# Patient Record
Sex: Male | Born: 1970 | Race: White | Hispanic: Yes | Marital: Married | Smoking: Never smoker
Health system: Southern US, Community
[De-identification: ages and names within clinical notes are randomized; demographics above are authoritative.]

## PROBLEM LIST (undated history)

## (undated) HISTORY — PX: NO PAST SURGERIES: SHX2092

---

## 2008-03-01 ENCOUNTER — Ambulatory Visit: Payer: Self-pay | Admitting: Internal Medicine

## 2010-06-26 ENCOUNTER — Ambulatory Visit: Payer: Self-pay | Admitting: Internal Medicine

## 2010-08-29 ENCOUNTER — Encounter: Payer: Self-pay | Admitting: Surgery

## 2010-09-17 ENCOUNTER — Encounter: Payer: Self-pay | Admitting: Surgery

## 2011-01-01 ENCOUNTER — Emergency Department: Payer: Self-pay | Admitting: Emergency Medicine

## 2011-12-04 IMAGING — CR RIGHT RING FINGER 2+V
1 series · 3 of 3 positions shown · non-contrast
Comparison: none

REASON FOR EXAM: smashed finger
COMMENTS:   LMP: (Male)

PROCEDURE:     MDR - MDR FINGER RING 4TH DIG RT HAND  - June 26, 2010  [DATE]
RESULT:     Images of the right fourth digit demonstrate no definite
fracture, dislocation or foreign body.

[Series 1: view not recorded · 0.17mm/px · 3 of 3 slices shown]
[im 1/3]
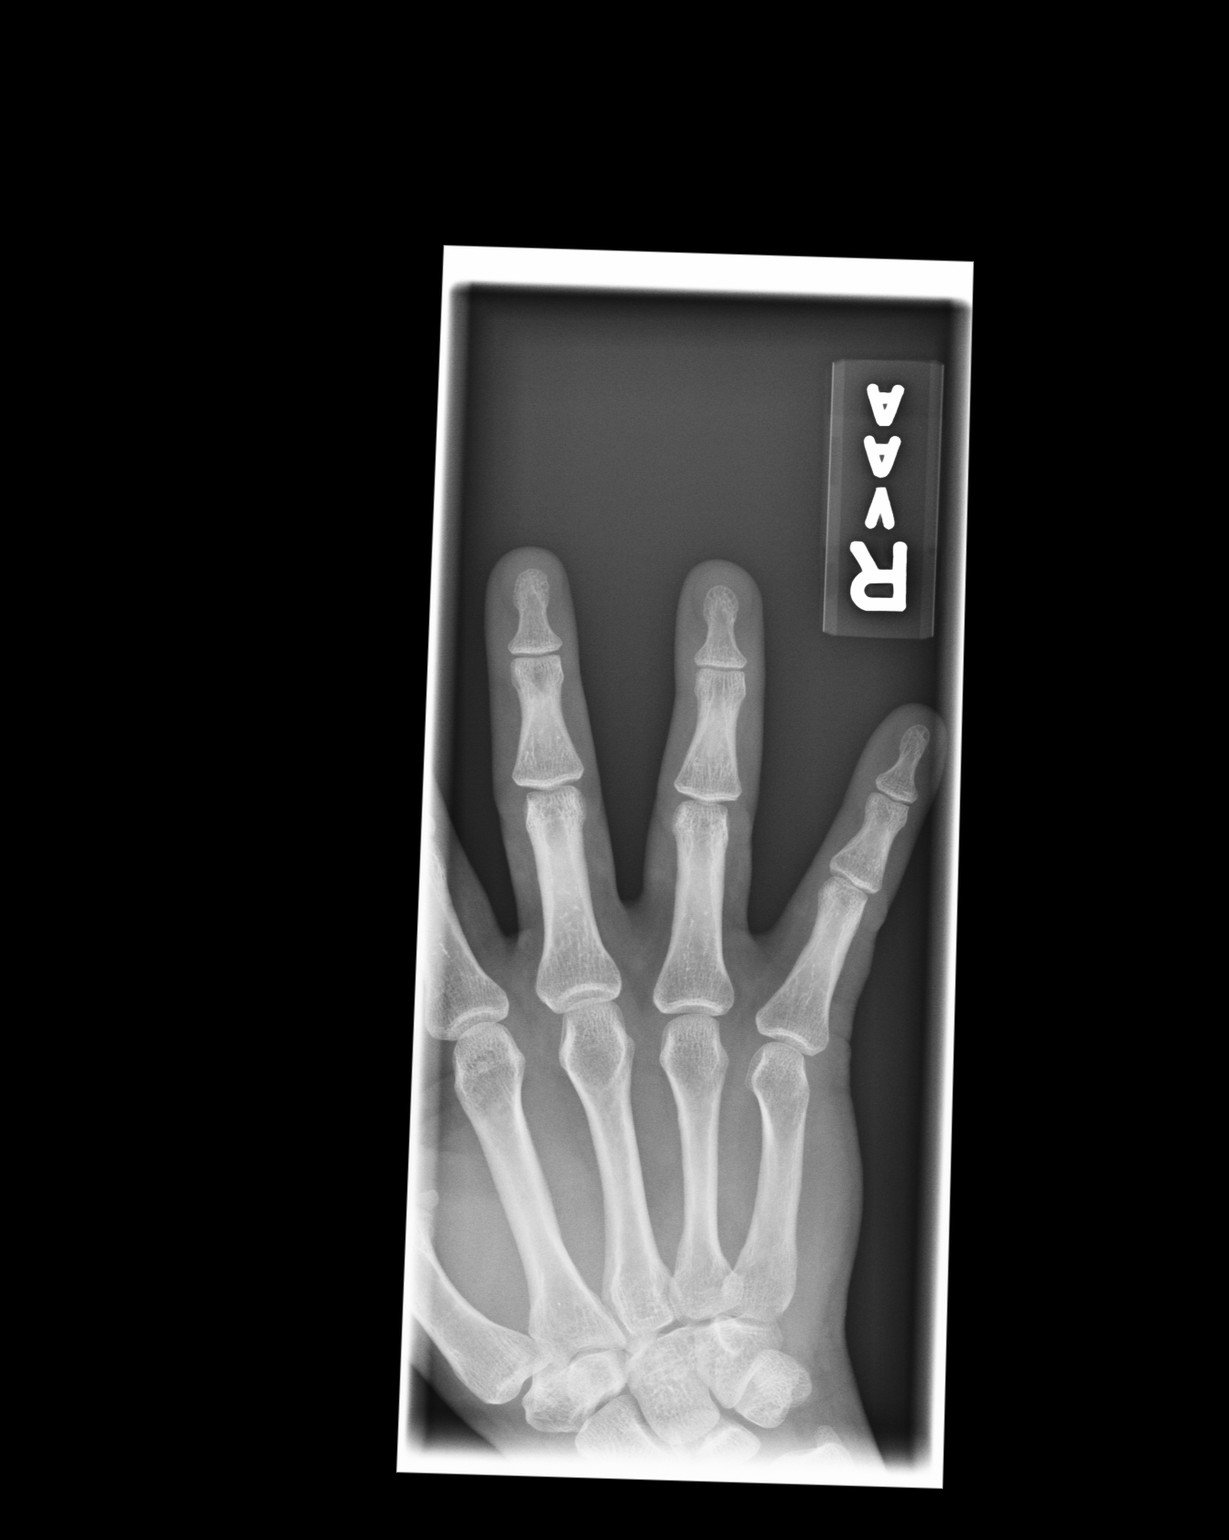
[im 2/3]
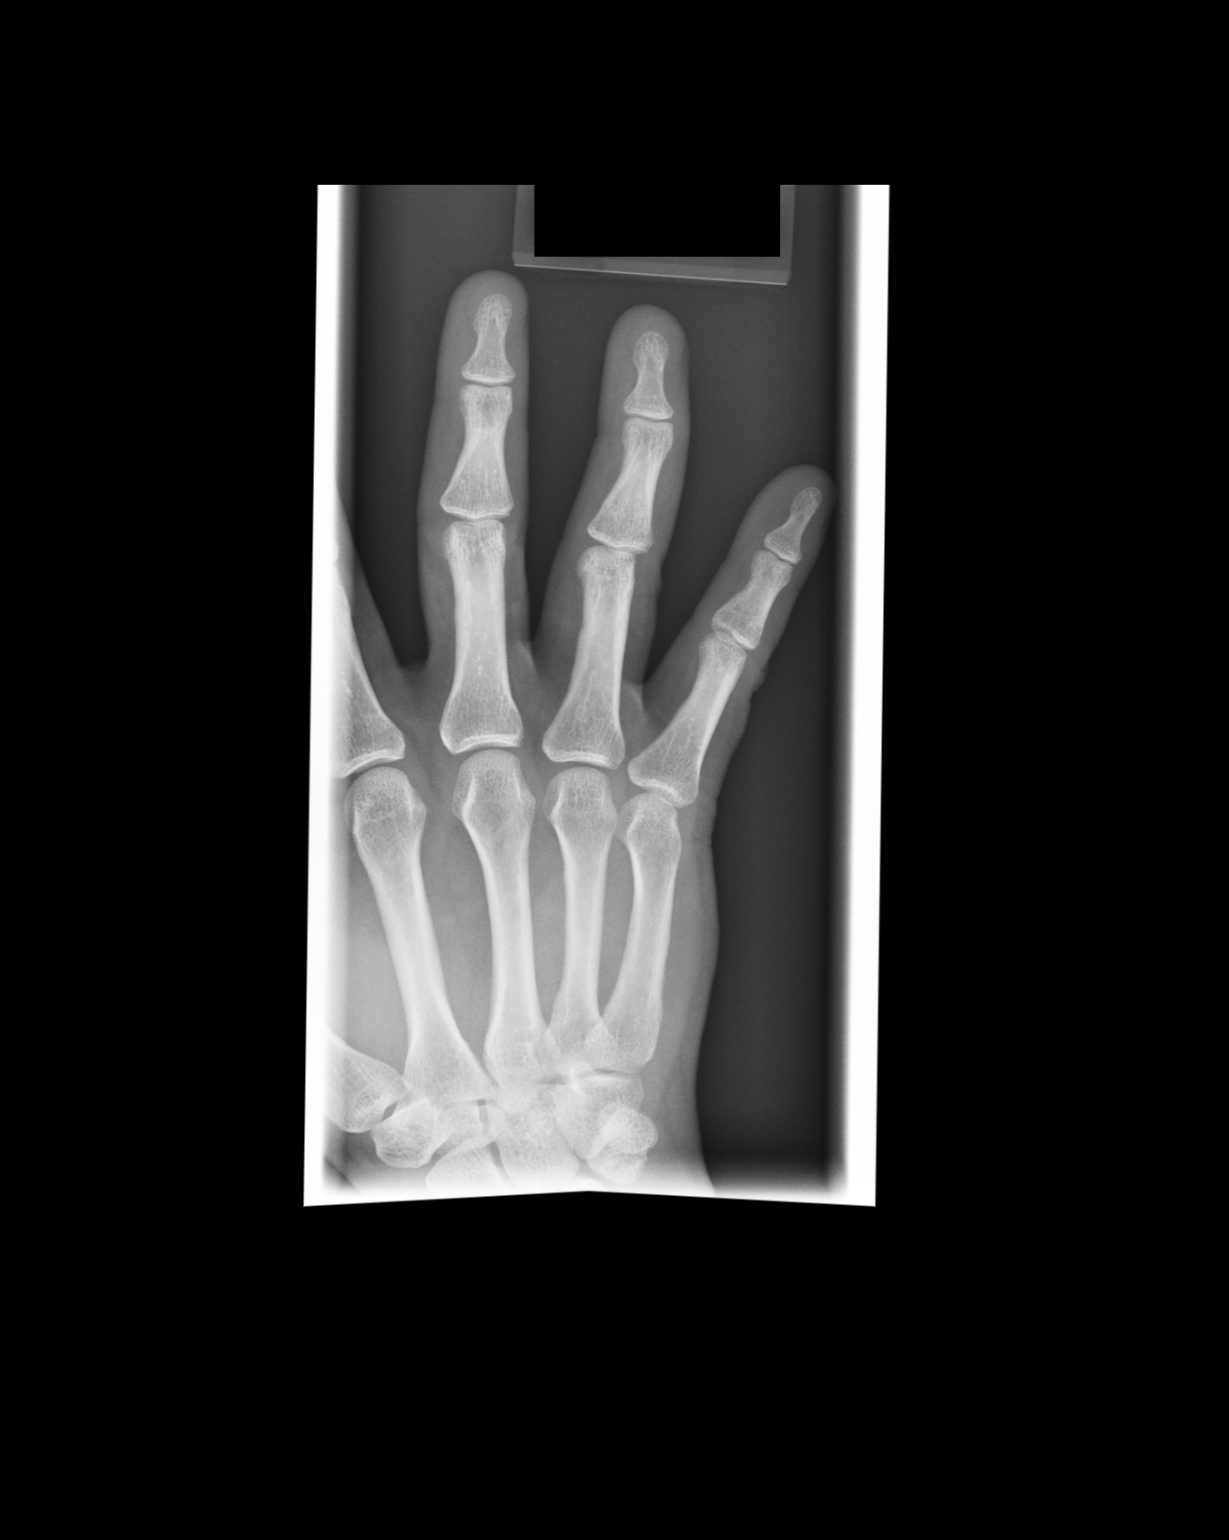
[im 3/3]
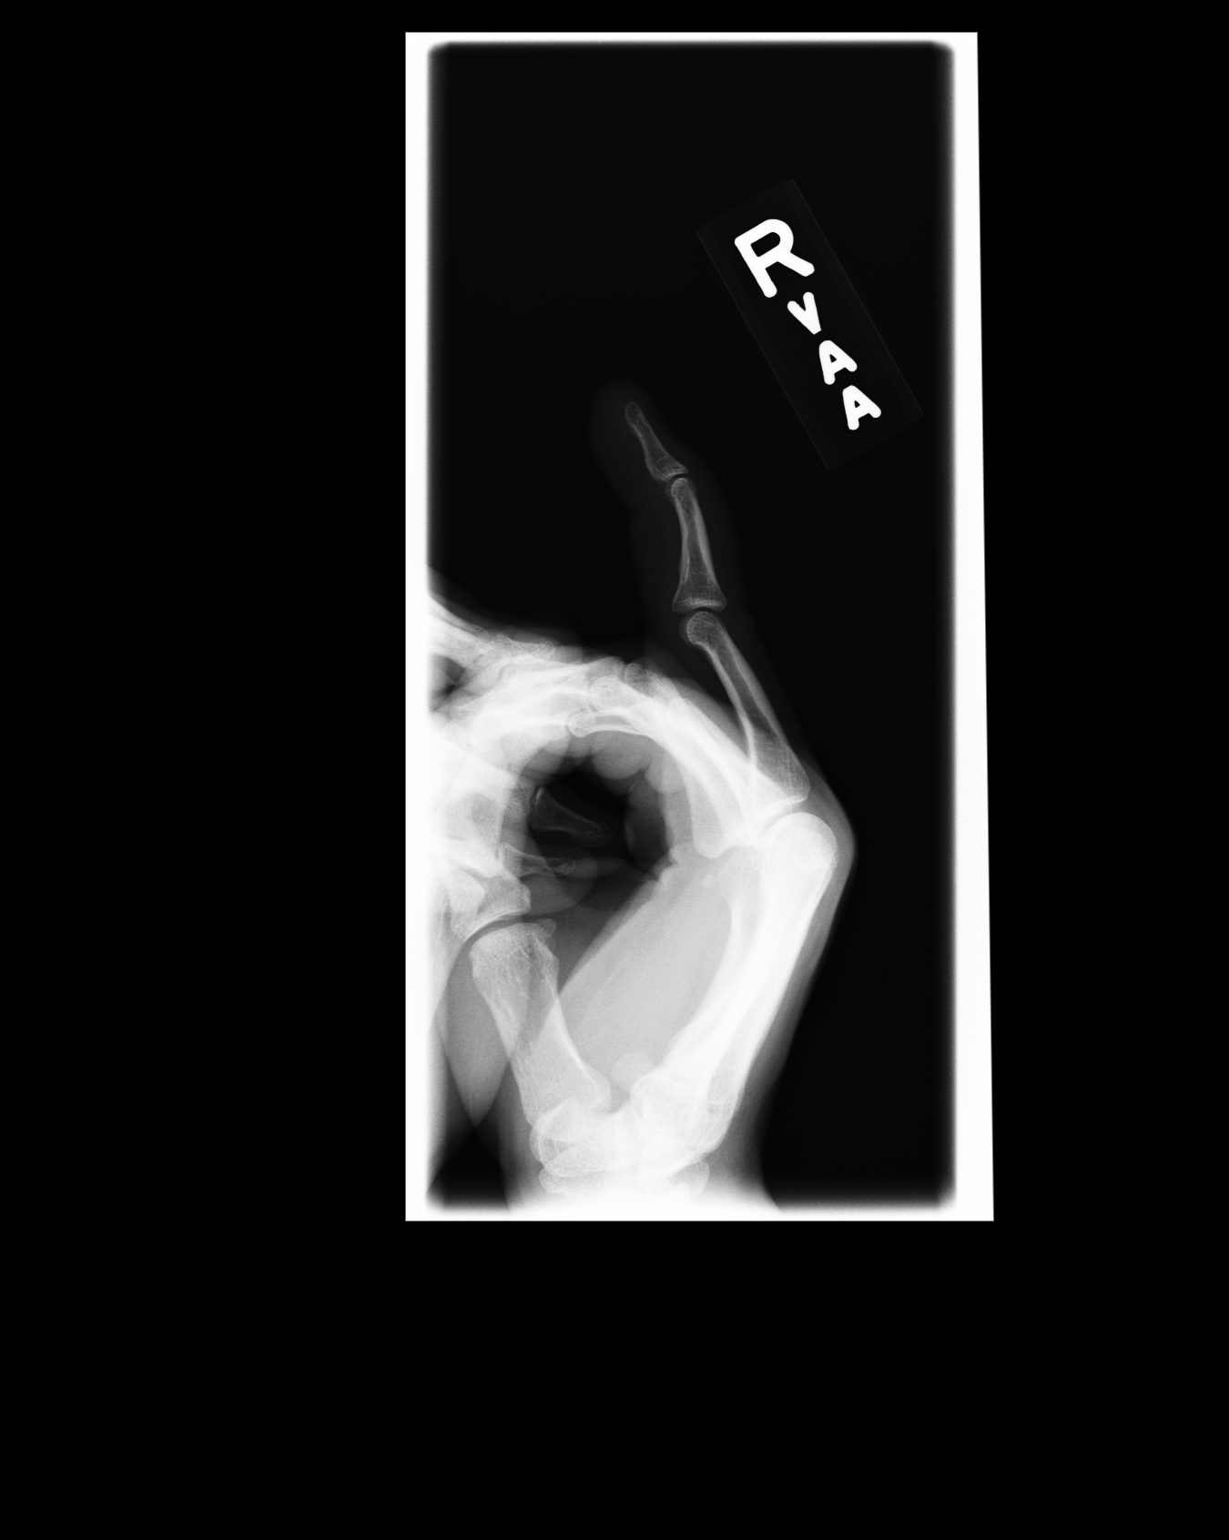

[3 of 3 positions shown; findings below may reference images not displayed]

IMPRESSION: No acute bony abnormality appreciated.

## 2012-06-10 IMAGING — CT CT ABD-PELV W/ CM
1 of 2 series · 15 of 32 positions shown, 19 images · IV contrast (isovue)
Comparison: None

REASON FOR EXAM: (1) diffuse abd pain w/ n/v and diarrhea; (2) diffuse
abd pain w/ n/v and diarrh
COMMENTS:

PROCEDURE:     CT  - CT ABDOMEN / PELVIS  W  - January 01, 2011  [DATE]
RESULT:     History: Abdominal pain
TECHNIQUE: Multiple axial images of the abdomen and pelvis were performed
from the lung bases to the pubic symphysis, with p.o. contrast and with 100
ml of Isovue 370 intravenous contrast.

[Series 2: 3mm soft tissue · axial · 0.79mm/px · z∈[-1062,-602]mm · 15 of 167 slices shown, 19 images]
[im 7/167  soft-tissue]
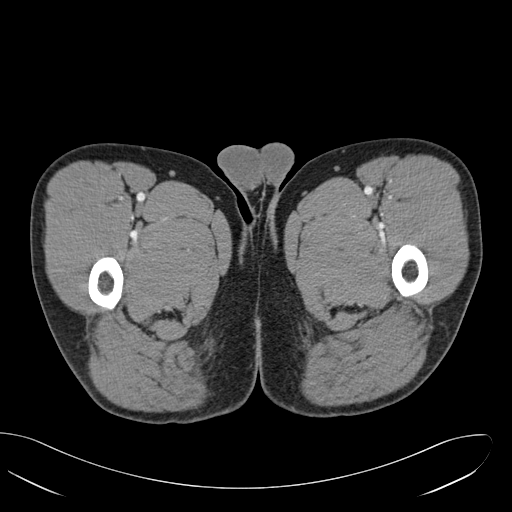
[im 7/167  bone]
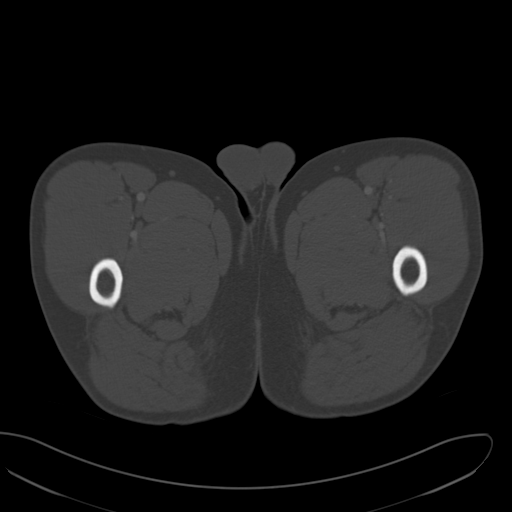
[im 21/167  soft-tissue]
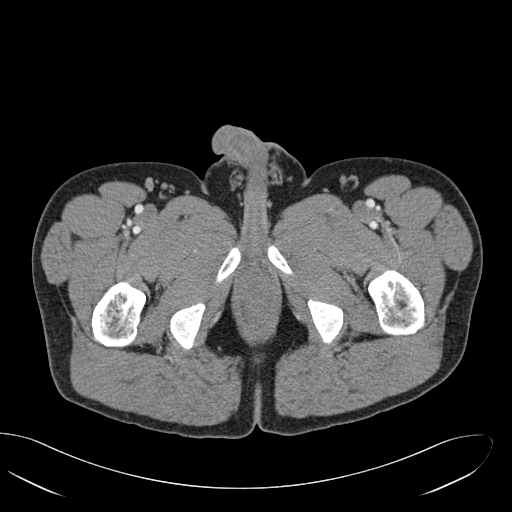
[im 35/167  soft-tissue]
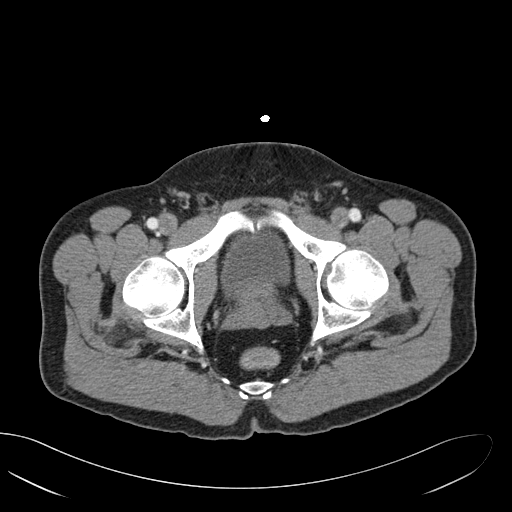
[im 49/167  soft-tissue]
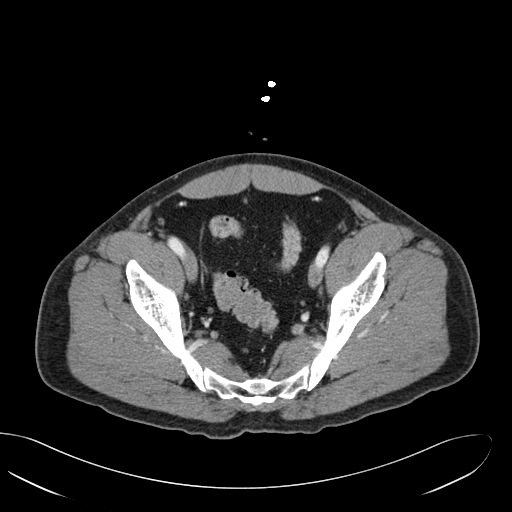
[im 56/167  soft-tissue]
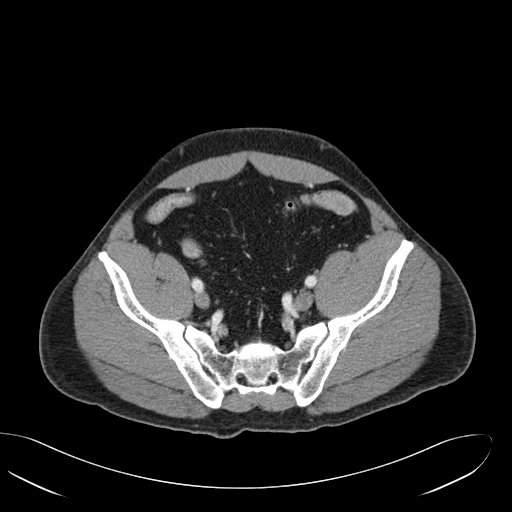
[im 70/167  soft-tissue]
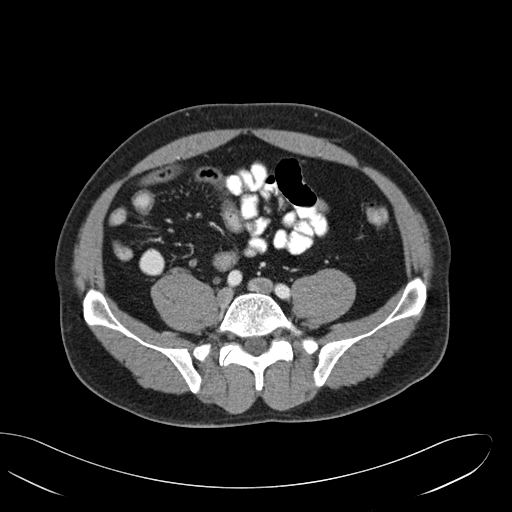
[im 84/167  soft-tissue]
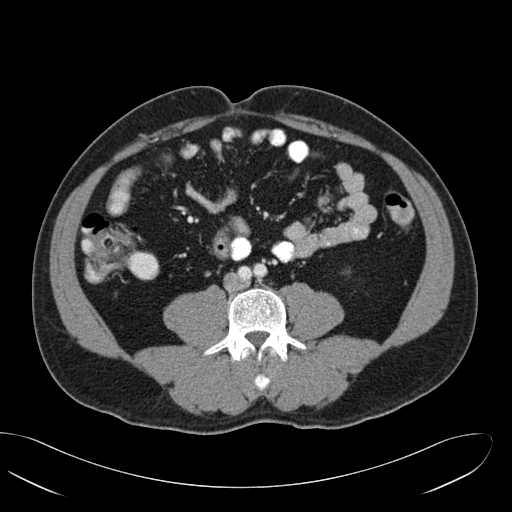
[im 97/167  soft-tissue]
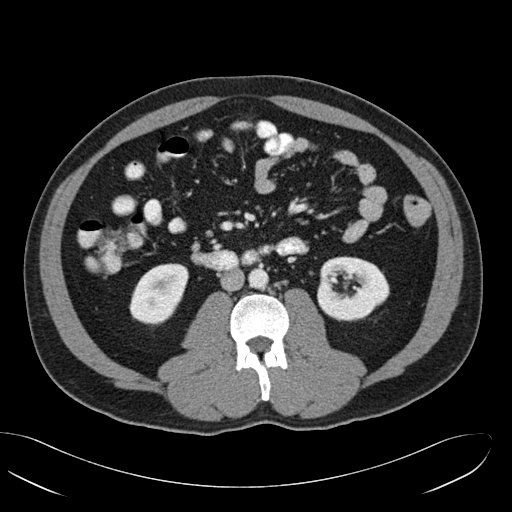
[im 111/167  soft-tissue]
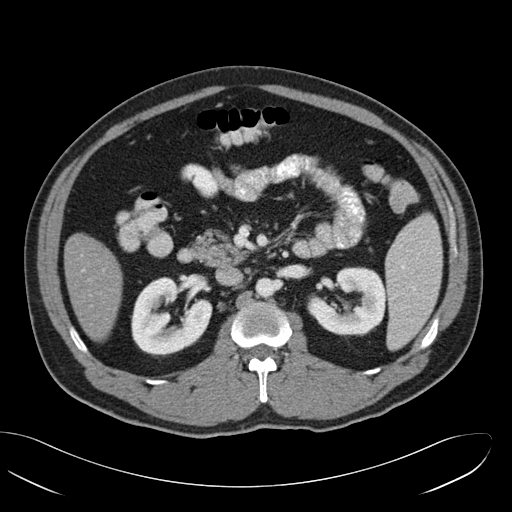
[im 111/167  bone]
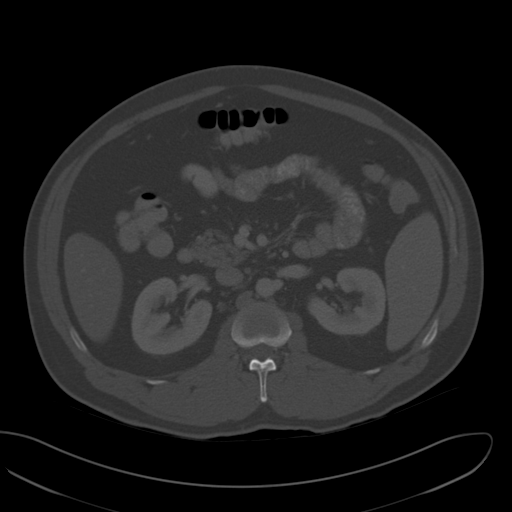
[im 118/167  soft-tissue]
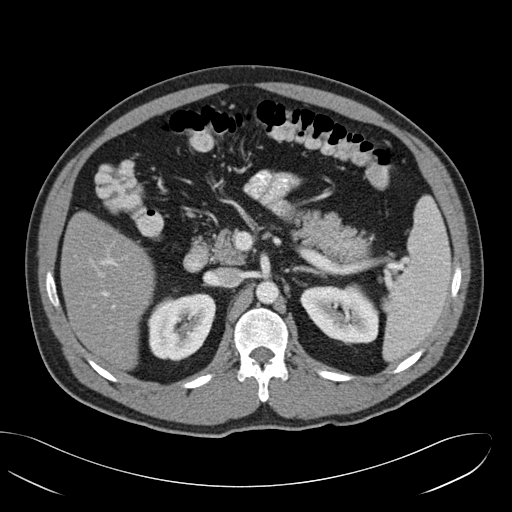
[im 132/167  soft-tissue]
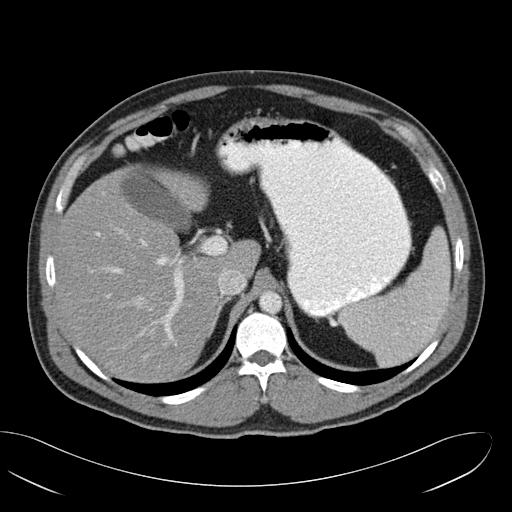
[im 139/167  lung]
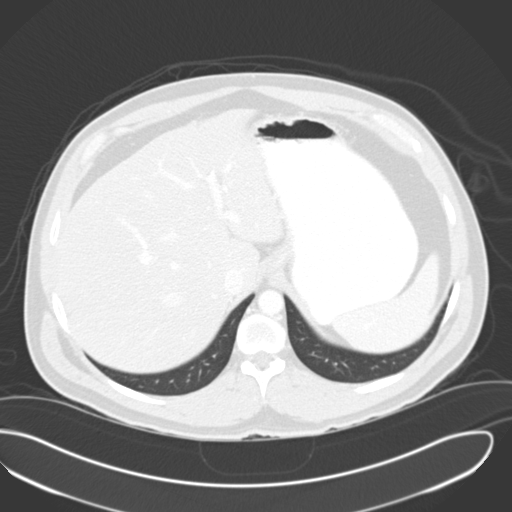
[im 146/167  soft-tissue]
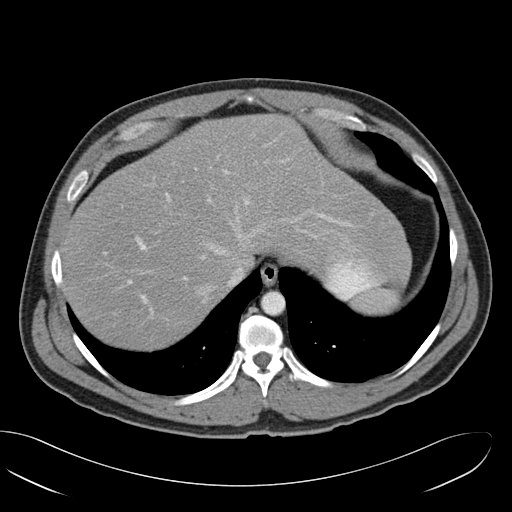
[im 146/167  lung]
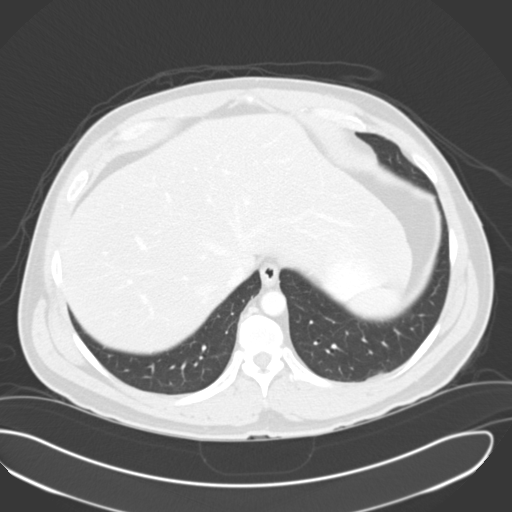
[im 153/167  lung]
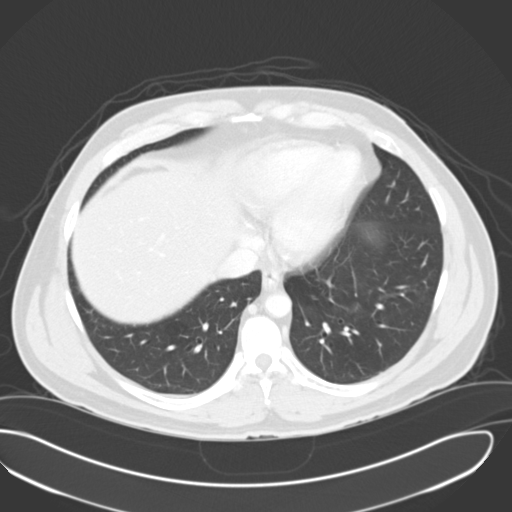
[im 160/167  soft-tissue]
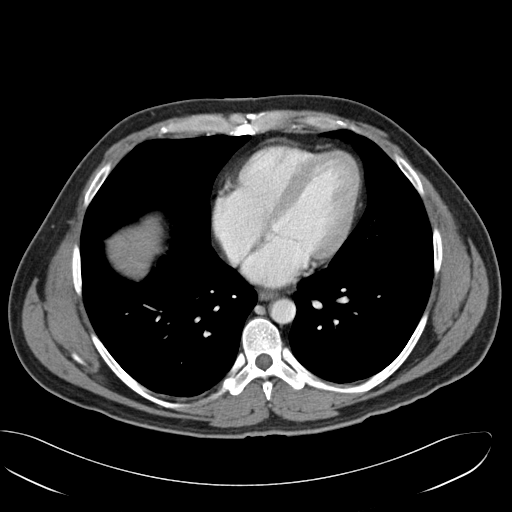
[im 160/167  lung]
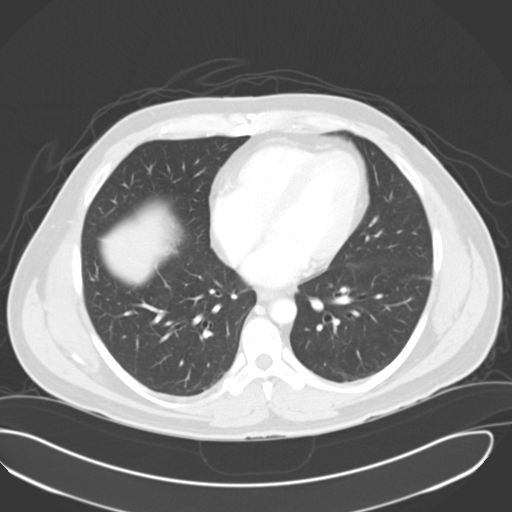

[15 of 32 positions shown; findings below may reference images not displayed]

FINDINGS: The lung bases are clear. There is no pneumothorax. The heart size is
normal.

The liver is diffusely low in attenuation likely secondary to hepatic
steatosis. There is no intrahepatic or extrahepatic biliary ductal
dilatation. The gallbladder is unremarkable. The spleen demonstrates no
focal abnormality. The kidneys, adrenal glands, and pancreas are normal. The
bladder is unremarkable.

The stomach, duodenum, small intestine, and large intestine demonstrate no
contrast extravasation or dilatation. There is right lower quadrant small
bowel wall thickening concerning for mild enteritis which may be secondary
to an infectious or inflammatory etiology. The terminal ileum ais normal.
There is a normal caliber appendix in the right lower quadrant without
periappendiceal inflammatory changes. There is no pneumoperitoneum,
pneumatosis, or portal venous gas. There is no abdominal or pelvic free
fluid. There is no lymphadenopathy.

The abdominal aorta is normal in caliber .

The osseous structures are unremarkable.
IMPRESSION: There is right lower quadrant small bowel wall thickening concerning for
mild enteritis which may be secondary to an infectious or inflammatory
etiology.

## 2016-07-12 ENCOUNTER — Ambulatory Visit
Admission: EM | Admit: 2016-07-12 | Discharge: 2016-07-12 | Disposition: A | Discharge status: Home / Self Care | Payer: BLUE CROSS/BLUE SHIELD | Attending: Emergency Medicine | Admitting: Emergency Medicine

## 2016-07-12 DIAGNOSIS — R030 Elevated blood-pressure reading, without diagnosis of hypertension: Secondary | ICD-10-CM | POA: Diagnosis not present

## 2016-07-12 DIAGNOSIS — R42 Dizziness and giddiness: Secondary | ICD-10-CM

## 2016-07-12 MED ORDER — MECLIZINE HCL 25 MG PO TABS: 25.0000 mg | ORAL_TABLET | Freq: Three times a day (TID) | ORAL | 0 refills | Status: DC | PRN

## 2016-07-12 NOTE — Discharge Instructions (Signed)
Decrease your salt intake. diet and exercise will lower your blood pressure significantly. It is important to keep your blood pressure under good control, as having a elevated for prolonged periods of time significantly increases your risk of stroke, heart attacks, kidney damage, eye damage, and other problems. Her blood pressure once a day, preferably at the same time every day, write this down. Bring this log to your next doctor's appointment. Return immediately to the ER if you start having chest pain, headache, problems seeing, problems talking, problems walking, if you feel like you're about to pass out, if you do pass out, if you have a seizure, or for any other concerns..Marland Kitchen

## 2016-07-12 NOTE — ED Triage Notes (Addendum)
Pt c/o dizzy about a month ago he would get dizzy if he looked up or down, and it went away. Now it is constant, no matter what he is doing he is dizzy. Worse when he lays down. Neck and Shoulder stiffness

## 2016-07-12 NOTE — ED Provider Notes (Signed)
HPI  SUBJECTIVE:  Scott Osborne is a 46 y.o. male who presents with 1 month of intermittent dizziness described as the room spinning around me. It resolved for 2 weeks and restarted 2-3 days ago. It is intermittent, lasting minutes. He reports nausea with this. Symptoms are worse with turning his head, there are no alleviating factors. He has not tried anything for this. He reports right ear pain this morning. He denies vomiting, chest pain, shortness of breath, presyncope, syncope. No ear fullness or change in his hearing. He reports tinnitus, but that this is been present "for years" and that it is not any new or different. No recent URI. No regular NSAIDs, recent antibiotics, diuretics. No arm or leg weakness, facial droop, aphasia, dysarthria. He does not take any herbal supplements. He has no past medical history although he states that he has been monitoring his blood pressure at home and states that it is been consistently elevated for the past several months. No history of diabetes, arrhythmia, atrial fibrillation, vertigo, stroke. Family history negative for stroke. PMD: None.    History reviewed. No pertinent past medical history.  History reviewed. No pertinent surgical history.  Family History  Problem Relation Age of Onset  . Hypertension Mother     Social History  Substance Use Topics  . Smoking status: Never Smoker  . Smokeless tobacco: Never Used  . Alcohol use No    No current facility-administered medications for this encounter.   Current Outpatient Prescriptions:  .  Cyanocobalamin (VITAMIN B 12 PO), Take by mouth., Disp: , Rfl:  .  meclizine (ANTIVERT) 25 MG tablet, Take 1 tablet (25 mg total) by mouth 3 (three) times daily as needed for dizziness., Disp: 30 tablet, Rfl: 0  No Known Allergies   ROS  As noted in HPI.   Physical Exam  BP (!) 150/95 (BP Location: Left Arm)   Pulse 95   Temp 98.1 F (36.7 C) (Oral)   Ht 5\' 3"  (1.6 m)   Wt 168 lb (76.2  kg)   SpO2 100%   BMI 29.76 kg/m   BP Readings from Last 3 Encounters:  07/12/16 (!) 150/95   Constitutional: Well developed, well nourished, no acute distress Eyes: PERRL, EOMI, conjunctiva normal bilaterally HENT: Normocephalic, atraumatic,mucus membranes moist Respiratory: Clear to auscultation bilaterally, no rales, no wheezing, no rhonchi Cardiovascular: Normal rate and rhythm, no murmurs, no gallops, no rubsNo carotid bruit GI: nondistended skin: No rash, skin intact Musculoskeletal: No edema, no tenderness, no deformities Neurologic: Alert & oriented x 3, CN II-XII  intact, no motor deficits, sensation grossly intact. Finger-nose, heel shin within normal limits. Tandem gait steady. Romberg negative. Positive Dix-Hallpike on the left side and vertigo also aggravated with sitting up. Psychiatric: Speech and behavior appropriate   ED Course   Medications - No data to display  No orders of the defined types were placed in this encounter.  No results found for this or any previous visit (from the past 24 hour(s)). No results found.  ED Clinical Impression  Vertigo  Elevated blood pressure reading  ED Assessment/Plan Presentation most consistent with a peripheral vertigo, most specifically BPPV. Doubt stroke, Mnire's disease. Home with meclizine, Epley maneuver handout and a primary care referral. Blood pressure also noted. Patient is otherwise asymptomatic. Advised him to monitor his blood pressure once today, preferably the same time every day and keep a log of it. He will bring this to a follow-up appointment with a primary care physician of his choice.  Discussed signs and symptoms of a hypertensive emergency and for the dizziness that should prompt return to the emergency department. Patient agrees with plan   Meds ordered this encounter  Medications  . Cyanocobalamin (VITAMIN B 12 PO)    Sig: Take by mouth.  . meclizine (ANTIVERT) 25 MG tablet    Sig: Take 1  tablet (25 mg total) by mouth 3 (three) times daily as needed for dizziness.    Dispense:  30 tablet    Refill:  0    *This clinic note was created using Scientist, clinical (histocompatibility and immunogenetics)Dragon dictation software. Therefore, there may be occasional mistakes despite careful proofreading.  ?   Domenick GongAshley Skyeler Scalese, MD 07/12/16 [id number]

## 2017-02-11 ENCOUNTER — Encounter: Payer: BLUE CROSS/BLUE SHIELD | Admitting: Unknown Physician Specialty

## 2019-11-02 ENCOUNTER — Ambulatory Visit
Admission: EM | Admit: 2019-11-02 | Discharge: 2019-11-02 | Disposition: A | Discharge status: Home / Self Care | Payer: Medicaid Other | Attending: Family Medicine | Admitting: Family Medicine

## 2019-11-02 ENCOUNTER — Other Ambulatory Visit: Payer: Self-pay

## 2019-11-02 DIAGNOSIS — J209 Acute bronchitis, unspecified: Secondary | ICD-10-CM | POA: Diagnosis present

## 2019-11-02 DIAGNOSIS — Z20822 Contact with and (suspected) exposure to covid-19: Secondary | ICD-10-CM | POA: Diagnosis not present

## 2019-11-02 MED ORDER — BENZONATATE 200 MG PO CAPS: 200.0000 mg | ORAL_CAPSULE | Freq: Three times a day (TID) | ORAL | 0 refills | Status: DC | PRN

## 2019-11-02 MED ORDER — AZITHROMYCIN 250 MG PO TABS: ORAL_TABLET | ORAL | 0 refills | Status: DC

## 2019-11-02 NOTE — ED Provider Notes (Signed)
[facility]    CSN: [id number] Arrival date & time: 11/02/19  1242      History   Chief Complaint Chief Complaint  Patient presents with  . Cough   HPI  49 year old male presents with cough and chest congestion.  10-day history of cough and chest congestion.  Cough is productive.  Patient states that his symptoms started 2 days after getting the Covid vaccine.  He thought that this may be related.  He has had no improvement.  No relieving factors.  No reports of shortness of breath.  No sick contacts.  No other complaints or concerns at this time.  Past Surgical History:  Procedure Laterality Date  . NO PAST SURGERIES       Home Medications    Prior to Admission medications   Medication Sig Start Date End Date Taking? Authorizing Provider  azithromycin (ZITHROMAX) 250 MG tablet 2 tablets on day 1, then 1 tablet daily on days 2-5. 11/02/19   Coral Spikes, DO  benzonatate (TESSALON) 200 MG capsule Take 1 capsule (200 mg total) by mouth 3 (three) times daily as needed for cough. 11/02/19   Coral Spikes, DO    Family History Family History  Problem Relation Age of Onset  . Hypertension Mother     Social History Social History   Tobacco Use  . Smoking status: Never Smoker  . Smokeless tobacco: Never Used  Substance Use Topics  . Alcohol use: No  . Drug use: No     Allergies   Patient has no known allergies.   Review of Systems Review of Systems  Constitutional: Negative for fever.  Respiratory: Positive for cough.    Physical Exam Triage Vital Signs ED Triage Vitals  Enc Vitals Group     BP 11/02/19 1303 (!) 161/103     Pulse Rate 11/02/19 1303 90     Resp 11/02/19 1303 15     Temp 11/02/19 1303 98.6 F (37 C)     Temp Source 11/02/19 1303 Oral     SpO2 11/02/19 1303 98 %     Weight 11/02/19 1301 170 lb (77.1 kg)     Height 11/02/19 1301 5\' 3"  (1.6 m)     Head Circumference --      Peak Flow --      Pain Score 11/02/19 1301 1   Pain Loc --      Pain Edu? --      Excl. in [place]? --    Updated Vital Signs BP (!) 161/103 (BP Location: Left Arm)   Pulse 90   Temp 98.6 F (37 C) (Oral)   Resp 15   Ht 5\' 3"  (1.6 m)   Wt 77.1 kg   SpO2 98%   BMI 30.11 kg/m   Visual Acuity Right Eye Distance:   Left Eye Distance:   Bilateral Distance:    Right Eye Near:   Left Eye Near:    Bilateral Near:     Physical Exam Vitals and nursing note reviewed.  Constitutional:      General: He is not in acute distress.    Appearance: Normal appearance. He is not ill-appearing.  HENT:     Head: Normocephalic and atraumatic.  Eyes:     General:        Right eye: No discharge.        Left eye: No discharge.     Conjunctiva/sclera: Conjunctivae normal.  Cardiovascular:     Rate and  Rhythm: Normal rate and regular rhythm.  Pulmonary:     Effort: Pulmonary effort is normal.     Breath sounds: Normal breath sounds. No wheezing, rhonchi or rales.  Neurological:     Mental Status: He is alert.  Psychiatric:        Mood and Affect: Mood normal.        Behavior: Behavior normal.    UC Treatments / Results  Labs (all labs ordered are listed, but only abnormal results are displayed) Labs Reviewed  SARS CORONAVIRUS 2 (TAT 6-24 HRS)    EKG   Radiology No results found.  Procedures Procedures (including critical care time)  Medications Ordered in UC Medications - No data to display  Initial Impression / Assessment and Plan / UC Course  I have reviewed the triage vital signs and the nursing notes.  Pertinent labs & imaging results that were available during my care of the patient were reviewed by me and considered in my medical decision making (see chart for details).    49 year old male presents with acute bronchitis.  Treating with azithromycin and Tessalon Perles.  Supportive care.  Final Clinical Impressions(s) / UC Diagnoses   Final diagnoses:  Acute bronchitis, unspecified organism   Discharge  Instructions   None    ED Prescriptions    Medication Sig Dispense Auth. Provider   azithromycin (ZITHROMAX) 250 MG tablet 2 tablets on day 1, then 1 tablet daily on days 2-5. 6 tablet Ennio Houp G, DO   benzonatate (TESSALON) 200 MG capsule Take 1 capsule (200 mg total) by mouth 3 (three) times daily as needed for cough. 20 capsule Tommie Sams, DO     PDMP not reviewed this encounter.   Tommie Sams, DO 11/02/19 1455

## 2019-11-02 NOTE — ED Triage Notes (Signed)
Patient complains of cough and chest congestion x 10 days. Patient states that his cough is productive. Reports that he had the covid vaccine 2 days before symptoms started so he was concerned the 2 were related.

## 2019-11-03 LAB — SARS CORONAVIRUS 2 (TAT 6-24 HRS): SARS Coronavirus 2: NEGATIVE

## 2020-06-05 ENCOUNTER — Ambulatory Visit
Admission: EM | Admit: 2020-06-05 | Discharge: 2020-06-05 | Disposition: A | Discharge status: Home / Self Care | Payer: Medicaid Other | Attending: Family Medicine | Admitting: Family Medicine

## 2020-06-05 ENCOUNTER — Encounter: Payer: Self-pay | Admitting: Emergency Medicine

## 2020-06-05 ENCOUNTER — Other Ambulatory Visit: Payer: Self-pay

## 2020-06-05 DIAGNOSIS — R42 Dizziness and giddiness: Secondary | ICD-10-CM | POA: Diagnosis not present

## 2020-06-05 MED ORDER — MECLIZINE HCL 25 MG PO TABS: 25.0000 mg | ORAL_TABLET | Freq: Three times a day (TID) | ORAL | 0 refills | Status: DC | PRN

## 2020-06-05 NOTE — ED Provider Notes (Signed)
[facility]    CSN: [id number] Arrival date & time: 06/05/20  1228      History   Chief Complaint Chief Complaint  Patient presents with  . Dizziness   HPI  49 year old male presents with the above complaint.  Patient states that he has had a 1 week history of dizziness.  He states that it seems to be worse when he moves his head certain ways.  He states that he has had some nausea but no vomiting.  He is well-appearing at this time.  He has no other complaints.  Denies ear pain.  No medications or interventions tried.  No other complaints.  Home Medications    Prior to Admission medications   Medication Sig Start Date End Date Taking? Authorizing Provider  meclizine (ANTIVERT) 25 MG tablet Take 1 tablet (25 mg total) by mouth 3 (three) times daily as needed for dizziness. 06/05/20   Tommie Sams, DO    Family History Family History  Problem Relation Age of Onset  . Hypertension Mother     Social History Social History   Tobacco Use  . Smoking status: Never Smoker  . Smokeless tobacco: Never Used  Vaping Use  . Vaping Use: Never used  Substance Use Topics  . Alcohol use: No  . Drug use: No     Allergies   Patient has no known allergies.   Review of Systems Review of Systems  Gastrointestinal: Positive for nausea.  Neurological: Positive for dizziness.   Physical Exam Triage Vital Signs ED Triage Vitals  Enc Vitals Group     BP 06/05/20 1243 (!) 148/95     Pulse Rate 06/05/20 1243 79     Resp 06/05/20 1243 16     Temp 06/05/20 1243 98.1 F (36.7 C)     Temp Source 06/05/20 1243 Oral     SpO2 06/05/20 1243 99 %     Weight 06/05/20 1241 165 lb (74.8 kg)     Height 06/05/20 1241 5\' 4"  (1.626 m)     Head Circumference --      Peak Flow --      Pain Score 06/05/20 1241 0     Pain Loc --      Pain Edu? --      Excl. in [place]? --    Updated Vital Signs BP (!) 148/95 (BP Location: Right Arm)   Pulse 79   Temp 98.1 F (36.7 C) (Oral)    Resp 16   Ht 5\' 4"  (1.626 m)   Wt 74.8 kg   SpO2 99%   BMI 28.32 kg/m   Visual Acuity Right Eye Distance:   Left Eye Distance:   Bilateral Distance:    Right Eye Near:   Left Eye Near:    Bilateral Near:     Physical Exam Vitals and nursing note reviewed.  Constitutional:      General: He is not in acute distress.    Appearance: Normal appearance. He is not ill-appearing.  HENT:     Head: Normocephalic and atraumatic.  Eyes:     General:        Right eye: No discharge.        Left eye: No discharge.     Conjunctiva/sclera: Conjunctivae normal.     Pupils: Pupils are equal, round, and reactive to light.  Cardiovascular:     Rate and Rhythm: Normal rate and regular rhythm.     Heart sounds: No murmur heard.   Pulmonary:  Effort: Pulmonary effort is normal.     Breath sounds: Normal breath sounds. No wheezing or rales.  Neurological:     General: No focal deficit present.     Mental Status: He is alert.  Psychiatric:        Mood and Affect: Mood normal.        Behavior: Behavior normal.    UC Treatments / Results  Labs (all labs ordered are listed, but only abnormal results are displayed) Labs Reviewed - No data to display  EKG   Radiology No results found.  Procedures Procedures (including critical care time)  Medications Ordered in UC Medications - No data to display  Initial Impression / Assessment and Plan / UC Course  I have reviewed the triage vital signs and the nursing notes.  Pertinent labs & imaging results that were available during my care of the patient were reviewed by me and considered in my medical decision making (see chart for details).    49 year old male presents with vertigo.  Treating with meclizine.  Final Clinical Impressions(s) / UC Diagnoses   Final diagnoses:  Vertigo   Discharge Instructions   None    ED Prescriptions    Medication Sig Dispense Auth. Provider   meclizine (ANTIVERT) 25 MG tablet Take 1  tablet (25 mg total) by mouth 3 (three) times daily as needed for dizziness. 30 tablet Tommie Sams, DO     PDMP not reviewed this encounter.   Tommie Sams, [place] 06/05/20 1559

## 2020-06-05 NOTE — ED Triage Notes (Signed)
Patient c/o dizziness that started a week ago.  Patient states that his symptoms get worse if he turns his head a certain way.

## 2022-03-23 ENCOUNTER — Ambulatory Visit
Admission: EM | Admit: 2022-03-23 | Discharge: 2022-03-23 | Disposition: A | Discharge status: Home / Self Care | Payer: Medicaid Other | Attending: Family Medicine | Admitting: Family Medicine

## 2022-03-23 DIAGNOSIS — L03818 Cellulitis of other sites: Secondary | ICD-10-CM

## 2022-03-23 MED ORDER — TRIAMCINOLONE ACETONIDE 0.1 % EX OINT: 1.0000 | TOPICAL_OINTMENT | Freq: Three times a day (TID) | CUTANEOUS | 0 refills | Status: DC

## 2022-03-23 MED ORDER — DOXYCYCLINE HYCLATE 100 MG PO CAPS: 100.0000 mg | ORAL_CAPSULE | Freq: Two times a day (BID) | ORAL | 0 refills | Status: AC

## 2022-03-23 NOTE — ED Triage Notes (Addendum)
Pt states he is here for skin eruption x5 days upper back, pt thinks it was a tick bite, pt states area is warm to the touch, area very itchy

## 2022-03-23 NOTE — Discharge Instructions (Addendum)

## 2022-03-23 NOTE — ED Provider Notes (Signed)
[facility]    CSN: [id number] Arrival date & time: 03/23/22  1747      History   Chief Complaint Chief Complaint  Patient presents with   Insect Bite    HPI Dalbert Stillings is a 51 y.o. male.   HPI  History from: patient. Kerrick Miler is a 51 y.o. male who reports finding  red spot on his upper back. He found it 5-6 days ago and the spot is very itchy and getting worse. States it felt warm.  He went running outside late at night. He is unsure if anything bit him.  Denies fever.  No headaches, nausea, diarrhea,, visual changes, extremity edema reported. His left shoulder on the side feels kinda numb. Has no difficulty moving his shoulder. Ambulatory without difficulty. His wife put some alcohol on the site that stopped the itching for a little bit but then it comes right back.    History reviewed. No pertinent past medical history.  There are no problems to display for this patient.   Past Surgical History:  Procedure Laterality Date   NO PAST SURGERIES         Home Medications    Prior to Admission medications   Medication Sig Start Date End Date Taking? Authorizing Provider  doxycycline (VIBRAMYCIN) 100 MG capsule Take 1 capsule (100 mg total) by mouth 2 (two) times daily for 7 days. 03/23/22 03/30/22 Yes Valissa Lyvers, DO  triamcinolone ointment (KENALOG) 0.1 % Apply 1 Application topically 3 (three) times daily. 03/23/22  Yes Oval Moralez, Ronnette Juniper, DO  meclizine (ANTIVERT) 25 MG tablet Take 1 tablet (25 mg total) by mouth 3 (three) times daily as needed for dizziness. 06/05/20   Coral Spikes, DO    Family History Family History  Problem Relation Age of Onset   Hypertension Mother     Social History Social History   Tobacco Use   Smoking status: Never   Smokeless tobacco: Never  Vaping Use   Vaping Use: Never used  Substance Use Topics   Alcohol use: No   Drug use: No     Allergies   Patient has no known allergies.   Review of  Systems Review of Systems : :negative unless otherwise stated in HPI.      Physical Exam Triage Vital Signs ED Triage Vitals  Enc Vitals Group     BP 03/23/22 1804 (!) 152/90     Pulse Rate 03/23/22 1804 93     Resp --      Temp 03/23/22 1804 98.3 F (36.8 C)     Temp src --      SpO2 03/23/22 1804 96 %     Weight 03/23/22 1803 170 lb (77.1 kg)     Height 03/23/22 1803 5\' 3"  (1.6 m)     Head Circumference --      Peak Flow --      Pain Score 03/23/22 1803 6     Pain Loc --      Pain Edu? --      Excl. in [place]? --    No data found.  Updated Vital Signs BP (!) 152/90 (BP Location: Left Arm)   Pulse 93   Temp 98.3 F (36.8 C)   Ht 5\' 3"  (1.6 m)   Wt 77.1 kg   SpO2 96%   BMI 30.11 kg/m   Visual Acuity Right Eye Distance:   Left Eye Distance:   Bilateral Distance:    Right Eye Near:   Left  Eye Near:    Bilateral Near:     Physical Exam  GEN: alert, well appearing male, in no acute distress    EYES: extra occular movements intact, no scleral injection  CV: regular rate  RESP: no increased work of breathing MSK: no extremity edema, no gross deformities NEURO: alert, moves all extremities appropriately, normal gait PSYCH: Normal affect, appropriate speech and behavior  SKIN: warm and dry; erythematous papular patch on the upper part of the thoracic spine towards the midline, there is a central papule that is crusty and hyperpigmented.  UC Treatments / Results  Labs (all labs ordered are listed, but only abnormal results are displayed) Labs Reviewed - No data to display  EKG   Radiology No results found.  Procedures Procedures (including critical care time)  Medications Ordered in UC Medications - No data to display  Initial Impression / Assessment and Plan / UC Course  I have reviewed the triage vital signs and the nursing notes.  Pertinent labs & imaging results that were available during my care of the patient were reviewed by me and considered in  my medical decision making (see chart for details).      Pt is a 51 y.o. male who presents after finding a pruritic rash on his upper back 5 nights ago after running outside.  Denies known insect bite. Overall, Ade is well appearing, well hydrated and in no respiratory distress.  Pt is afebrile.  He is mildly hypertensive.  Advised to follow with his PCP about his elevated blood pressure.  Treat with doxycyline for presumed cellulitis.  No LOC ointment for itching and possible contact dermatitis.  Return and ED precautions given.  Patient voiced understanding.   Reviewed expectations re: course of current medical issues. Questions answered. Outlined signs and symptoms indicating need for more acute intervention. Patient verbalized understanding. After Visit Summary given.   Final Clinical Impressions(s) / UC Diagnoses   Final diagnoses:  Cellulitis of other specified site     Discharge Instructions      Stop by the pharmacy to pick up your prescriptions.  Follow up with your primary care provider as needed.  Go to ED for red flag symptoms, including; fevers you cannot reduce with Tylenol/Motrin, severe headaches, vision changes, numbness/weakness in part of the body, lethargy, confusion, intractable vomiting, severe dehydration, chest pain, breathing difficulty, severe persistent abdominal or pelvic pain, signs of severe infection (increased redness, swelling of an area), feeling faint or passing out, dizziness, etc. You should especially go to the ED for sudden acute worsening of condition if you do not elect to go at this time.       ED Prescriptions     Medication Sig Dispense Auth. Provider   doxycycline (VIBRAMYCIN) 100 MG capsule Take 1 capsule (100 mg total) by mouth 2 (two) times daily for 7 days. 14 capsule Georgette Helmer, DO   triamcinolone ointment (KENALOG) 0.1 % Apply 1 Application topically 3 (three) times daily. 30 g Katha Cabal, DO      PDMP not  reviewed this encounter.              Katha Cabal, DO 03/23/22 1826

## 2022-06-12 ENCOUNTER — Encounter: Payer: Self-pay | Admitting: Emergency Medicine

## 2022-06-12 ENCOUNTER — Ambulatory Visit (INDEPENDENT_AMBULATORY_CARE_PROVIDER_SITE_OTHER): Payer: Medicaid Other

## 2022-06-12 ENCOUNTER — Ambulatory Visit
Admission: EM | Admit: 2022-06-12 | Discharge: 2022-06-12 | Disposition: A | Discharge status: Home / Self Care | Payer: Medicaid Other | Attending: Emergency Medicine | Admitting: Emergency Medicine

## 2022-06-12 DIAGNOSIS — J40 Bronchitis, not specified as acute or chronic: Secondary | ICD-10-CM

## 2022-06-12 MED ORDER — PREDNISONE 10 MG (21) PO TBPK: ORAL_TABLET | ORAL | 0 refills | Status: DC

## 2022-06-12 MED ORDER — BENZONATATE 100 MG PO CAPS: 200.0000 mg | ORAL_CAPSULE | Freq: Three times a day (TID) | ORAL | 0 refills | Status: AC

## 2022-06-12 MED ORDER — PROMETHAZINE-DM 6.25-15 MG/5ML PO SYRP: 5.0000 mL | ORAL_SOLUTION | Freq: Four times a day (QID) | ORAL | 0 refills | Status: AC | PRN

## 2022-06-12 MED ORDER — IPRATROPIUM BROMIDE 0.06 % NA SOLN: 2.0000 | Freq: Four times a day (QID) | NASAL | 12 refills | Status: AC

## 2022-06-12 NOTE — Discharge Instructions (Signed)
Take the prednisone according to the package instructions to help with pulmonary inflammation.  Use the Atrovent nasal spray, 2 squirts up each nostril every 6 hours, to help with nasal congestion and postnasal drip.  Use the Tessalon Perles every 8 hours for your cough.  Taken with a small sip of water.  They may give you some numbness to the base of your tongue or metallic taste in her mouth, this is normal.  They are designed to calm down the cough reflex.  Use the Promethazine DM cough syrup at bedtime as will make you drowsy.  You may take 1 teaspoon (5 mL) every 6 hours.  Return for reevaluation for new or worsening symptoms.

## 2022-06-12 NOTE — ED Triage Notes (Signed)
Pt presents with productive cough, bodyaches and chest congestion x 10 days.

## 2022-06-12 NOTE — ED Provider Notes (Signed)
[facility]    CSN: [id number] Arrival date & time: 06/12/22  1310      History   Chief Complaint Chief Complaint  Patient presents with   Cough   Generalized Body Aches    HPI Scott Osborne is a 50 y.o. male.   HPI  51 year old male here for evaluation of cough.  Patient reports that for last 2 days he has been experiencing a productive cough and chest congestion that is associated with some intermittent fevers and headache.  He denies any nasal congestion, sore throat, shortness breath, or wheezing.  He states he has been taking Zyrtec because he thought it might be allergies.  His son has similar symptoms and they thought it might be related to a Christmas tree so they removed it from the house this morning.  Patient does not have any history of asthma or allergies to Christmas trees that he is aware of.  History reviewed. No pertinent past medical history.  There are no problems to display for this patient.   Past Surgical History:  Procedure Laterality Date   NO PAST SURGERIES         Home Medications    Prior to Admission medications   Medication Sig Start Date End Date Taking? Authorizing Provider  benzonatate (TESSALON) 100 MG capsule Take 2 capsules (200 mg total) by mouth every 8 (eight) hours. 06/12/22  Yes Margarette Canada, NP  ipratropium (ATROVENT) 0.06 % nasal spray Place 2 sprays into both nostrils 4 (four) times daily. 06/12/22  Yes Margarette Canada, NP  predniSONE (STERAPRED UNI-PAK 21 TAB) 10 MG (21) TBPK tablet Take 6 tablets on day 1, 5 tablets day 2, 4 tablets day 3, 3 tablets day 4, 2 tablets day 5, 1 tablet day 6 06/12/22  Yes Margarette Canada, NP  promethazine-dextromethorphan (PROMETHAZINE-DM) 6.25-15 MG/5ML syrup Take 5 mLs by mouth 4 (four) times daily as needed. 06/12/22  Yes Margarette Canada, NP  meclizine (ANTIVERT) 25 MG tablet Take 1 tablet (25 mg total) by mouth 3 (three) times daily as needed for dizziness. 06/05/20   Coral Spikes,  DO  triamcinolone ointment (KENALOG) 0.1 % Apply 1 Application topically 3 (three) times daily. 03/23/22   Lyndee Hensen, DO    Family History Family History  Problem Relation Age of Onset   Hypertension Mother     Social History Social History   Tobacco Use   Smoking status: Never   Smokeless tobacco: Never  Vaping Use   Vaping Use: Never used  Substance Use Topics   Alcohol use: No   Drug use: No     Allergies   Patient has no known allergies.   Review of Systems Review of Systems  Constitutional:  Positive for fever.  HENT:  Negative for congestion, ear pain, rhinorrhea and sore throat.   Respiratory:  Positive for cough. Negative for shortness of breath and wheezing.      Physical Exam Triage Vital Signs ED Triage Vitals  Enc Vitals Group     BP 06/12/22 1450 (!) 131/93     Pulse Rate 06/12/22 1449 94     Resp 06/12/22 1449 16     Temp 06/12/22 1449 98.9 F (37.2 C)     Temp Source 06/12/22 1449 Oral     SpO2 06/12/22 1449 97 %     Weight --      Height --      Head Circumference --      Peak Flow --  Pain Score 06/12/22 1448 6     Pain Loc --      Pain Edu? --      Excl. in [place]? --    No data found.  Updated Vital Signs BP (!) 131/93   Pulse 94   Temp 98.9 F (37.2 C) (Oral)   Resp 16   SpO2 97%   Visual Acuity Right Eye Distance:   Left Eye Distance:   Bilateral Distance:    Right Eye Near:   Left Eye Near:    Bilateral Near:     Physical Exam Vitals and nursing note reviewed.  Constitutional:      Appearance: Normal appearance. He is not ill-appearing.  HENT:     Head: Normocephalic and atraumatic.     Nose: Congestion and rhinorrhea present.     Comments: Mucosa is erythematous and edematous with clear discharge in both nares.    Mouth/Throat:     Mouth: Mucous membranes are moist.     Pharynx: Oropharynx is clear. No oropharyngeal exudate or posterior oropharyngeal erythema.  Cardiovascular:     Rate and Rhythm:  Normal rate and regular rhythm.     Pulses: Normal pulses.     Heart sounds: Normal heart sounds. No murmur heard.    No friction rub. No gallop.  Pulmonary:     Effort: Pulmonary effort is normal.     Breath sounds: Rhonchi present. No wheezing or rales.     Comments: Patient has coarse breath sounds in all lung fields. Musculoskeletal:     Cervical back: Normal range of motion and neck supple.  Lymphadenopathy:     Cervical: No cervical adenopathy.  Skin:    General: Skin is warm and dry.     Capillary Refill: Capillary refill takes less than 2 seconds.     Findings: No erythema or rash.  Neurological:     General: No focal deficit present.     Mental Status: He is alert and oriented to person, place, and time.  Psychiatric:        Mood and Affect: Mood normal.        Behavior: Behavior normal.        Thought Content: Thought content normal.        Judgment: Judgment normal.      UC Treatments / Results  Labs (all labs ordered are listed, but only abnormal results are displayed) Labs Reviewed - No data to display  EKG   Radiology DG Chest 2 View  Result Date: 06/12/2022 CLINICAL DATA:  Cough for several days EXAM: CHEST - 2 VIEW COMPARISON:  None Available. FINDINGS: The heart size and mediastinal contours are within normal limits. Both lungs are clear. The visualized skeletal structures are unremarkable. IMPRESSION: No active cardiopulmonary disease. Electronically Signed   By: Inez Catalina M.D.   On: 06/12/2022 15:41    Procedures Procedures (including critical care time)  Medications Ordered in UC Medications - No data to display  Initial Impression / Assessment and Plan / UC Course  I have reviewed the triage vital signs and the nursing notes.  Pertinent labs & imaging results that were available during my care of the patient were reviewed by me and considered in my medical decision making (see chart for details).   Patient is a nontoxic-appearing  51 year old male here for evaluation of productive cough with intermittent fevers that been present for last 10 days.  Patient denied any nasal congestion or runny nose though on exam his nasal  mucosa is erythematous and edematous with clear discharge in both nares.  There is no appreciable postnasal drip or posterior oropharyngeal erythema or injection.  No cervical lymphadenopathy on exam.  Patient does have coarse lung sounds in all lung fields on exam.  I will order a chest x-ray to evaluate for the presence of pneumonia given the patient's intermittent fevers and productive cough.  The patient is not in any respiratory distress and he is able to speak in full sentences without dyspnea or tachypnea.  Radiology impression of chest x-ray states no active cardiopulmonary disease.  I will discharge patient home with a diagnosis of bronchitis and prescribed Atrovent nasal spray to help with the nasal congestion and postnasal drip, Tessalon Perles to help with cough, and Promethazine DM cough syrup.  I will also do a short dose of prednisone to help with the pulmonary inflammation.   Final Clinical Impressions(s) / UC Diagnoses   Final diagnoses:  Bronchitis     Discharge Instructions      Take the prednisone according to the package instructions to help with pulmonary inflammation.  Use the Atrovent nasal spray, 2 squirts up each nostril every 6 hours, to help with nasal congestion and postnasal drip.  Use the Tessalon Perles every 8 hours for your cough.  Taken with a small sip of water.  They may give you some numbness to the base of your tongue or metallic taste in her mouth, this is normal.  They are designed to calm down the cough reflex.  Use the Promethazine DM cough syrup at bedtime as will make you drowsy.  You may take 1 teaspoon (5 mL) every 6 hours.  Return for reevaluation for new or worsening symptoms.      ED Prescriptions     Medication Sig Dispense Auth. Provider    benzonatate (TESSALON) 100 MG capsule Take 2 capsules (200 mg total) by mouth every 8 (eight) hours. 21 capsule Becky Augusta, NP   ipratropium (ATROVENT) 0.06 % nasal spray Place 2 sprays into both nostrils 4 (four) times daily. 15 mL Becky Augusta, NP   predniSONE (STERAPRED UNI-PAK 21 TAB) 10 MG (21) TBPK tablet Take 6 tablets on day 1, 5 tablets day 2, 4 tablets day 3, 3 tablets day 4, 2 tablets day 5, 1 tablet day 6 21 tablet Becky Augusta, NP   promethazine-dextromethorphan (PROMETHAZINE-DM) 6.25-15 MG/5ML syrup Take 5 mLs by mouth 4 (four) times daily as needed. 118 mL Becky Augusta, NP      PDMP not reviewed this encounter.   Becky Augusta, NP 06/12/22 1556

## 2022-06-16 ENCOUNTER — Ambulatory Visit
Admission: EM | Admit: 2022-06-16 | Discharge: 2022-06-16 | Disposition: A | Discharge status: Home / Self Care | Payer: Medicaid Other | Attending: Family Medicine | Admitting: Family Medicine

## 2022-06-16 ENCOUNTER — Other Ambulatory Visit: Payer: Self-pay

## 2022-06-16 DIAGNOSIS — K529 Noninfective gastroenteritis and colitis, unspecified: Secondary | ICD-10-CM

## 2022-06-16 MED ORDER — DICYCLOMINE HCL 20 MG PO TABS: 20.0000 mg | ORAL_TABLET | Freq: Two times a day (BID) | ORAL | 0 refills | Status: AC

## 2022-06-16 MED ORDER — ONDANSETRON 8 MG PO TBDP: 8.0000 mg | ORAL_TABLET | Freq: Three times a day (TID) | ORAL | 0 refills | Status: AC | PRN

## 2022-06-16 NOTE — ED Triage Notes (Addendum)
Pt reports nausea and diarrhea for past 6 days. Started the morning after he was drinking tequila. States he has vomited 3 times in past six days. Denies pain unless after meals and then starts cramping.

## 2022-06-16 NOTE — Discharge Instructions (Signed)
Take the Zofran every 8 hours as needed for nausea and vomiting.  They are an oral disintegrating tablet and you can place them on her under your tongue and then will be absorbed.  Use the Bentyl (dicyclomine) every 6 hours as needed for abdominal cramping.  Follow a clear liquid diet for the next 6 to 12 hours.  Clear liquids consist of broth, ginger ale, water, Pedialyte, and Jell-O.  After 6 to 12 hours, if you are tolerating clear liquids, you can advance to bland foods such as bananas, rice, applesauce, and toast.  If you tolerate bland foods you can continue to advance your diet as you see fit.  If you develop a fever over 100.5, increased abdominal pain, bloody vomit, or bloody stool return for reevaluation or go to the ER.  

## 2022-06-16 NOTE — ED Provider Notes (Signed)
[facility]    CSN: [id number] Arrival date & time: 06/16/22  1515      History   Chief Complaint Chief Complaint  Patient presents with   Diarrhea    HPI Scott Osborne is a 51 y.o. male.   HPI  51 year old male here for evaluation of GI complaints.  Patient reports that for last 6 days he has been experiencing nausea and diarrhea with meals.  Meals also causes stomach to cramp.  He has had 3 separate episodes of vomiting in the last 6 days as well.  He denies any blood in his stool, abdominal pain, or fever.  He has been using Imodium with limited relief of symptoms.  He has been eating tomatoes and drinking orange juice to help with symptoms.  History reviewed. No pertinent past medical history.  There are no problems to display for this patient.   Past Surgical History:  Procedure Laterality Date   NO PAST SURGERIES         Home Medications    Prior to Admission medications   Medication Sig Start Date End Date Taking? Authorizing Provider  dicyclomine (BENTYL) 20 MG tablet Take 1 tablet (20 mg total) by mouth 2 (two) times daily. 06/16/22  Yes Becky Augusta, NP  ondansetron (ZOFRAN-ODT) 8 MG disintegrating tablet Take 1 tablet (8 mg total) by mouth every 8 (eight) hours as needed for nausea or vomiting. 06/16/22  Yes Becky Augusta, NP  benzonatate (TESSALON) 100 MG capsule Take 2 capsules (200 mg total) by mouth every 8 (eight) hours. 06/12/22   Becky Augusta, NP  ipratropium (ATROVENT) 0.06 % nasal spray Place 2 sprays into both nostrils 4 (four) times daily. 06/12/22   Becky Augusta, NP  promethazine-dextromethorphan (PROMETHAZINE-DM) 6.25-15 MG/5ML syrup Take 5 mLs by mouth 4 (four) times daily as needed. 06/12/22   Becky Augusta, NP    Family History Family History  Problem Relation Age of Onset   Hypertension Mother     Social History Social History   Tobacco Use   Smoking status: Never   Smokeless tobacco: Never  Vaping Use   Vaping  Use: Never used  Substance Use Topics   Alcohol use: Not Currently    Comment: seldom   Drug use: No     Allergies   Patient has no known allergies.   Review of Systems Review of Systems  Constitutional:  Negative for fever.  Gastrointestinal:  Positive for abdominal pain, diarrhea, nausea and vomiting. Negative for blood in stool.     Physical Exam Triage Vital Signs ED Triage Vitals  Enc Vitals Group     BP 06/16/22 1615 (!) 150/80     Pulse Rate 06/16/22 1615 94     Resp 06/16/22 1615 17     Temp 06/16/22 1615 98 F (36.7 C)     Temp Source 06/16/22 1615 Oral     SpO2 06/16/22 1615 100 %     Weight 06/16/22 1610 168 lb (76.2 kg)     Height 06/16/22 1610 5\' 3"  (1.6 m)     Head Circumference --      Peak Flow --      Pain Score 06/16/22 1610 0     Pain Loc --      Pain Edu? --      Excl. in [place]? --    No data found.  Updated Vital Signs BP (!) 150/80 (BP Location: Left Arm)   Pulse 94   Temp 98 F (36.7 C) (  Oral)   Resp 17   Ht 5\' 3"  (1.6 m)   Wt 168 lb (76.2 kg)   SpO2 100%   BMI 29.76 kg/m   Visual Acuity Right Eye Distance:   Left Eye Distance:   Bilateral Distance:    Right Eye Near:   Left Eye Near:    Bilateral Near:     Physical Exam Vitals and nursing note reviewed.  Constitutional:      Appearance: Normal appearance. He is not ill-appearing.  HENT:     Head: Normocephalic and atraumatic.  Cardiovascular:     Rate and Rhythm: Normal rate and regular rhythm.     Pulses: Normal pulses.     Heart sounds: Normal heart sounds. No murmur heard.    No friction rub. No gallop.  Pulmonary:     Effort: Pulmonary effort is normal.     Breath sounds: Wheezing and rhonchi present.  Abdominal:     General: Abdomen is flat.     Palpations: Abdomen is soft.     Tenderness: There is no abdominal tenderness. There is no guarding or rebound.  Skin:    General: Skin is warm and dry.     Capillary Refill: Capillary refill takes less than 2 seconds.      Findings: No rash.  Neurological:     General: No focal deficit present.     Mental Status: He is alert and oriented to person, place, and time.  Psychiatric:        Mood and Affect: Mood normal.        Behavior: Behavior normal.        Thought Content: Thought content normal.        Judgment: Judgment normal.      UC Treatments / Results  Labs (all labs ordered are listed, but only abnormal results are displayed) Labs Reviewed - No data to display  EKG   Radiology No results found.  Procedures Procedures (including critical care time)  Medications Ordered in UC Medications - No data to display  Initial Impression / Assessment and Plan / UC Course  I have reviewed the triage vital signs and the nursing notes.  Pertinent labs & imaging results that were available during my care of the patient were reviewed by me and considered in my medical decision making (see chart for details).   Patient is a pleasant, nontoxic-appearing 51 year old male here for evaluation of GI complaints as outlined HPI above.  He was evaluated earlier in the week for cough and nasal congestion and was diagnosed with bronchitis.  He states has not been to able to take his prednisone because not been able to eat food.  He states he had a a lot of nausea and when he eats he has intense of stomach cramping followed by diarrhea.  When he does not eat he does not have any cramping, pain, or diarrhea.  He has not seen any blood in his stool.  He states that when he came in to be evaluated for the bronchitis he had not had any diarrhea so he did not mention it as his symptoms started the night before.  He has been using Imodium without any improvement of his symptoms and he is also been eating a lot of citrus fruits, tomatoes, and drinking orange juice.  I advised the patient that this food selection may be contributing to his symptoms but due to the high amount of citric acid in it.  His gut may very well  be  upset secondary to his ongoing respiratory infection and the lymphatic drainage that is dumped into the gut at the body clears the infection.  I have given him a list of food choices to use to help alleviate the diarrhea and have suggested he stop drinking orange juice and eating citrus fruits and tomatoes.  I have prescribed him Zofran that he can use every 8 hours as needed for nausea and dicyclomine that he can use every 6 hours as needed for stomach cramping.  Return and ER precautions reviewed.   Final Clinical Impressions(s) / UC Diagnoses   Final diagnoses:  Gastroenteritis     Discharge Instructions      Take the Zofran every 8 hours as needed for nausea and vomiting.  They are an oral disintegrating tablet and you can place them on her under your tongue and then will be absorbed.  Use the Bentyl (dicyclomine) every 6 hours as needed for abdominal cramping.  Follow a clear liquid diet for the next 6 to 12 hours.  Clear liquids consist of broth, ginger ale, water, Pedialyte, and Jell-O.  After 6 to 12 hours, if you are tolerating clear liquids, you can advance to bland foods such as bananas, rice, applesauce, and toast.  If you tolerate bland foods you can continue to advance your diet as you see fit.  If you develop a fever over 100.5, increased abdominal pain, bloody vomit, or bloody stool return for reevaluation or go to the ER.      ED Prescriptions     Medication Sig Dispense Auth. Provider   ondansetron (ZOFRAN-ODT) 8 MG disintegrating tablet Take 1 tablet (8 mg total) by mouth every 8 (eight) hours as needed for nausea or vomiting. 20 tablet Becky Augusta, NP   dicyclomine (BENTYL) 20 MG tablet Take 1 tablet (20 mg total) by mouth 2 (two) times daily. 20 tablet Becky Augusta, NP      PDMP not reviewed this encounter.   Becky Augusta, NP 06/16/22 [phone]

## 2023-02-15 ENCOUNTER — Ambulatory Visit
Admission: RE | Admit: 2023-02-15 | Discharge: 2023-02-15 | Disposition: A | Discharge status: Home / Self Care | Payer: Medicaid Other | Source: Ambulatory Visit | Attending: Family Medicine | Admitting: Family Medicine

## 2023-02-15 VITALS — BP 165/98 | HR 98 | Temp 97.7°F | Resp 15 | Ht 63.0 in | Wt 168.0 lb

## 2023-02-15 DIAGNOSIS — R42 Dizziness and giddiness: Secondary | ICD-10-CM | POA: Diagnosis not present

## 2023-02-15 DIAGNOSIS — R519 Headache, unspecified: Secondary | ICD-10-CM

## 2023-02-15 DIAGNOSIS — R030 Elevated blood-pressure reading, without diagnosis of hypertension: Secondary | ICD-10-CM

## 2023-02-15 DIAGNOSIS — H5509 Other forms of nystagmus: Secondary | ICD-10-CM

## 2023-02-15 NOTE — ED Notes (Signed)
Patient is being discharged from the Urgent Care and sent to the Emergency Department via private vehicle with wife . Per Dr. Alric Ran, patient is in need of higher level of care due to elevated BP and dizziness and headache. Patient is aware and verbalizes understanding of plan of care.  Vitals:   02/15/23 1540  BP: (!) 165/98  Pulse: 98  Resp: 15  Temp: 97.7 F (36.5 C)  SpO2: 96%

## 2023-02-15 NOTE — ED Provider Notes (Signed)
[facility]    CSN: [id number] Arrival date & time: 02/15/23  1517      History   Chief Complaint Chief Complaint  Patient presents with   Dizziness    Appointment    HPI Scott Osborne is a 52 y.o. male.   Dizziness.  Sudden onset.  Started 1 week ago.  Dizziness is defined by spinning sensation.  Triggered by laying down on left side or looking up.  Not associated with slurry speech, gait disturbance, blurry vision.  Happens worse at night.  No recent changes in medication.  Associated with headache.  Did have a similar episode around 5 years ago.   Dizziness   History reviewed. No pertinent past medical history.  There are no problems to display for this patient.   Past Surgical History:  Procedure Laterality Date   NO PAST SURGERIES         Home Medications    Prior to Admission medications   Medication Sig Start Date End Date Taking? Authorizing Provider  benzonatate (TESSALON) 100 MG capsule Take 2 capsules (200 mg total) by mouth every 8 (eight) hours. 06/12/22   Becky Augusta, NP  dicyclomine (BENTYL) 20 MG tablet Take 1 tablet (20 mg total) by mouth 2 (two) times daily. 06/16/22   Becky Augusta, NP  ipratropium (ATROVENT) 0.06 % nasal spray Place 2 sprays into both nostrils 4 (four) times daily. 06/12/22   Becky Augusta, NP  ondansetron (ZOFRAN-ODT) 8 MG disintegrating tablet Take 1 tablet (8 mg total) by mouth every 8 (eight) hours as needed for nausea or vomiting. 06/16/22   Becky Augusta, NP  promethazine-dextromethorphan (PROMETHAZINE-DM) 6.25-15 MG/5ML syrup Take 5 mLs by mouth 4 (four) times daily as needed. 06/12/22   Becky Augusta, NP    Family History Family History  Problem Relation Age of Onset   Hypertension Mother     Social History Social History   Tobacco Use   Smoking status: Never   Smokeless tobacco: Never  Vaping Use   Vaping status: Never Used  Substance Use Topics   Alcohol use: Not Currently    Comment: seldom    Drug use: No     Allergies   Patient has no known allergies.   Review of Systems Review of Systems  Neurological:  Positive for dizziness.  Positive for headache and dizziness.  Negative otherwise   Physical Exam Triage Vital Signs ED Triage Vitals  Encounter Vitals Group     BP 02/15/23 1540 (!) 165/98     Systolic BP Percentile --      Diastolic BP Percentile --      Pulse Rate 02/15/23 1540 98     Resp 02/15/23 1540 15     Temp 02/15/23 1540 97.7 F (36.5 C)     Temp Source 02/15/23 1540 Oral     SpO2 02/15/23 1540 96 %     Weight 02/15/23 1539 167 lb 15.9 oz (76.2 kg)     Height 02/15/23 1539 5\' 3"  (1.6 m)     Head Circumference --      Peak Flow --      Pain Score 02/15/23 1539 0     Pain Loc --      Pain Education --      Exclude from Growth Chart --    No data found.  Updated Vital Signs BP (!) 165/98 (BP Location: Right Arm)   Pulse 98   Temp 97.7 F (36.5 C) (Oral)   Resp 15  Ht 5\' 3"  (1.6 m)   Wt 76.2 kg   SpO2 96%   BMI 29.76 kg/m   Visual Acuity Right Eye Distance:   Left Eye Distance:   Bilateral Distance:    Right Eye Near:   Left Eye Near:    Bilateral Near:     Physical Exam Alert awake oriented to time place person.  No acute distress. Head is atraumatic normocephalic. Normal conjunctiva.  Of bilaterally reactive pupils.  Corneal reflex intact bilaterally.  Extraocular movements are preserved. Neck is supple.  No carotid bruit noted. S1-S2 heard.  No murmur gallop noted.  No irregularities noted. Breathing normal inspection.  Bilateral entry present.  No wheezing or rhonchi noted. No peripheral swelling noted. Radial pulse 2+ bilaterally. Power is 5 out of 5 both upper extremities and lower extremities.  Reflexes 2+ both upper and lower extremities and sensation intact both upper lower extremities. Cerebellar signs negative. Dix-Hallpike maneuver reveals vertical nystagmus. Normal ear exam without any fluid behind the eardrum  or erythema. No maxillary sinus tenderness noted. No pharyngeal erythema noted.  UC Treatments / Results  Labs (all labs ordered are listed, but only abnormal results are displayed) Labs Reviewed - No data to display  EKG   Radiology No results found.  Procedures Procedures (including critical care time)  Medications Ordered in UC Medications - No data to display  Initial Impression / Assessment and Plan / UC Course  I have reviewed the triage vital signs and the nursing notes.  Pertinent labs & imaging results that were available during my care of the patient were reviewed by me and considered in my medical decision making (see chart for details).      Final Clinical Impressions(s) / UC Diagnoses   Final diagnoses:  Dizziness and giddiness  Elevated blood pressure reading  Vertical nystagmus  Acute nonintractable headache, unspecified headache type     Discharge Instructions      You have 1 week history of persistent dizziness when laying on the left side as well as looking up associated with vertical nystagmus upon Dix-Hallpike maneuver associated with headache and elevated blood pressure.  I cannot rule out cerebrovascular etiology.  I recommend you to go to the emergency room for further evaluation and treatment.   ED Prescriptions   None    PDMP not reviewed this encounter.   Lura Em, MD 02/15/23 [phone]

## 2023-02-15 NOTE — ED Triage Notes (Signed)
Patient states that he has dizziness at night when he goes to bed.  Patient states that it feels like the room is spinning when he lies down or when he moves or turns his head a certain way.

## 2023-02-15 NOTE — Discharge Instructions (Addendum)
You have 1 week history of persistent dizziness when laying on the left side as well as looking up associated with vertical nystagmus upon Dix-Hallpike maneuver associated with headache and elevated blood pressure.  I cannot rule out cerebrovascular etiology.  I recommend you to go to the emergency room for further evaluation and treatment.
